# Patient Record
Sex: Female | Born: 1940 | Race: White | Hispanic: No | Smoking: Former smoker
Health system: Southern US, Community
[De-identification: ages and names within clinical notes are randomized; demographics above are authoritative.]

## PROBLEM LIST (undated history)

## (undated) DIAGNOSIS — E7800 Pure hypercholesterolemia, unspecified: Secondary | ICD-10-CM

## (undated) DIAGNOSIS — I10 Essential (primary) hypertension: Secondary | ICD-10-CM

## (undated) HISTORY — PX: OTHER SURGICAL HISTORY: SHX169

## (undated) HISTORY — DX: Essential (primary) hypertension: I10

## (undated) HISTORY — DX: Pure hypercholesterolemia, unspecified: E78.00

## (undated) HISTORY — PX: CHOLECYSTECTOMY: SHX55

---

## 2001-02-16 ENCOUNTER — Encounter: Payer: Self-pay | Admitting: Internal Medicine

## 2001-02-16 ENCOUNTER — Ambulatory Visit (HOSPITAL_COMMUNITY): Admission: RE | Admit: 2001-02-16 | Discharge: 2001-02-16 | Payer: Self-pay | Admitting: Internal Medicine

## 2001-02-28 ENCOUNTER — Ambulatory Visit (HOSPITAL_COMMUNITY): Admission: RE | Admit: 2001-02-28 | Discharge: 2001-02-28 | Payer: Self-pay | Admitting: Internal Medicine

## 2001-02-28 ENCOUNTER — Encounter: Payer: Self-pay | Admitting: Internal Medicine

## 2001-03-18 ENCOUNTER — Observation Stay (HOSPITAL_COMMUNITY): Admission: RE | Admit: 2001-03-18 | Discharge: 2001-03-19 | Payer: Self-pay | Admitting: General Surgery

## 2002-01-20 ENCOUNTER — Ambulatory Visit (HOSPITAL_COMMUNITY): Admission: RE | Admit: 2002-01-20 | Discharge: 2002-01-20 | Payer: Self-pay | Admitting: Internal Medicine

## 2002-01-20 ENCOUNTER — Encounter: Payer: Self-pay | Admitting: Internal Medicine

## 2002-04-28 ENCOUNTER — Other Ambulatory Visit: Admission: RE | Admit: 2002-04-28 | Discharge: 2002-04-28 | Payer: Self-pay | Admitting: Obstetrics & Gynecology

## 2002-10-03 ENCOUNTER — Ambulatory Visit (HOSPITAL_COMMUNITY): Admission: RE | Admit: 2002-10-03 | Discharge: 2002-10-03 | Payer: Self-pay | Admitting: Internal Medicine

## 2002-10-03 ENCOUNTER — Encounter: Payer: Self-pay | Admitting: Internal Medicine

## 2002-10-04 ENCOUNTER — Ambulatory Visit (HOSPITAL_COMMUNITY): Admission: RE | Admit: 2002-10-04 | Discharge: 2002-10-04 | Payer: Self-pay | Admitting: Obstetrics & Gynecology

## 2002-10-04 ENCOUNTER — Encounter: Payer: Self-pay | Admitting: Obstetrics & Gynecology

## 2002-12-01 ENCOUNTER — Encounter: Admission: RE | Admit: 2002-12-01 | Discharge: 2003-03-01 | Payer: Self-pay

## 2003-08-07 ENCOUNTER — Encounter: Payer: Self-pay | Admitting: Internal Medicine

## 2003-08-07 ENCOUNTER — Ambulatory Visit (HOSPITAL_COMMUNITY): Admission: RE | Admit: 2003-08-07 | Discharge: 2003-08-07 | Payer: Self-pay | Admitting: Internal Medicine

## 2008-06-19 ENCOUNTER — Ambulatory Visit (HOSPITAL_COMMUNITY): Admission: RE | Admit: 2008-06-19 | Discharge: 2008-06-19 | Payer: Self-pay | Admitting: Internal Medicine

## 2008-06-27 ENCOUNTER — Ambulatory Visit (HOSPITAL_COMMUNITY): Admission: RE | Admit: 2008-06-27 | Discharge: 2008-06-27 | Payer: Self-pay | Admitting: Internal Medicine

## 2009-05-28 ENCOUNTER — Ambulatory Visit (HOSPITAL_COMMUNITY): Admission: RE | Admit: 2009-05-28 | Discharge: 2009-05-28 | Payer: Self-pay | Admitting: Internal Medicine

## 2009-06-25 ENCOUNTER — Ambulatory Visit (HOSPITAL_COMMUNITY): Admission: RE | Admit: 2009-06-25 | Discharge: 2009-06-25 | Payer: Self-pay | Admitting: Internal Medicine

## 2009-06-25 ENCOUNTER — Ambulatory Visit (HOSPITAL_COMMUNITY): Admission: RE | Admit: 2009-06-25 | Discharge: 2009-06-25 | Payer: Self-pay | Admitting: Urology

## 2009-10-22 ENCOUNTER — Ambulatory Visit (HOSPITAL_COMMUNITY): Admission: RE | Admit: 2009-10-22 | Discharge: 2009-10-22 | Payer: Self-pay | Admitting: Internal Medicine

## 2009-10-22 ENCOUNTER — Encounter: Payer: Self-pay | Admitting: Internal Medicine

## 2010-02-20 IMAGING — US US RENAL
1 series · 14 of 25 positions shown · non-contrast
Comparison: CT abdomen 06/25/2009.

CLINICAL DATA: Hematuria.

RENAL/URINARY TRACT ULTRASOUND COMPLETE

[Series 1: us renal · 0.30mm/px · 14 of 45 slices shown]
[im 1/45]
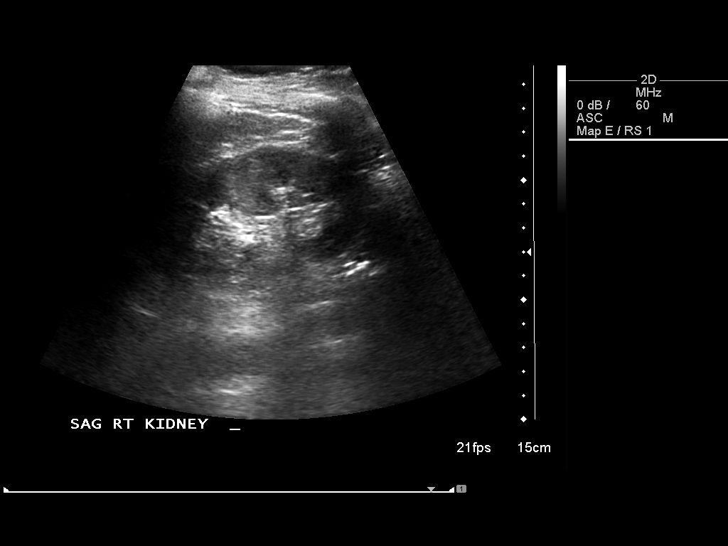
[im 4/45]
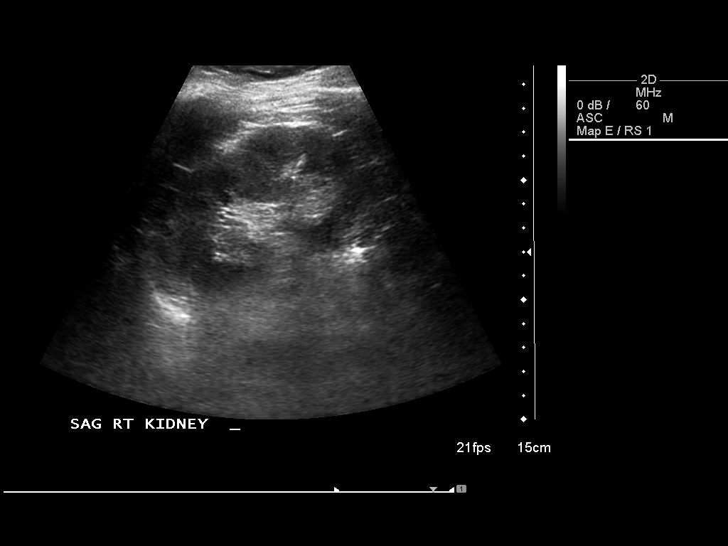
[im 8/45]
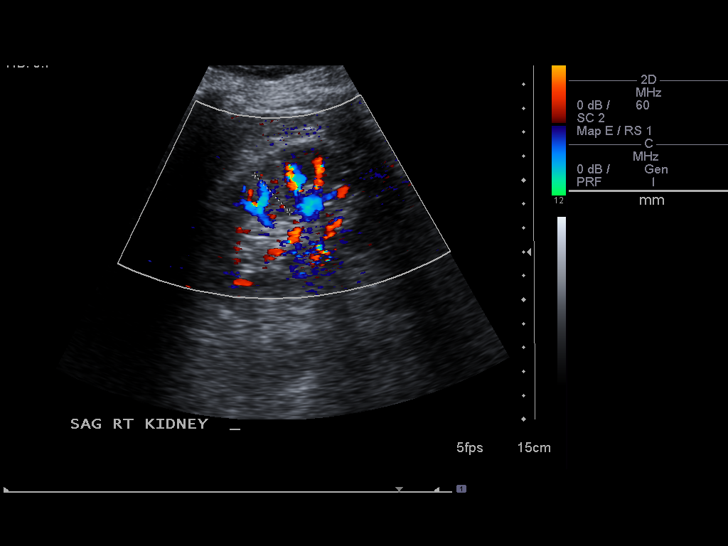
[im 12/45]
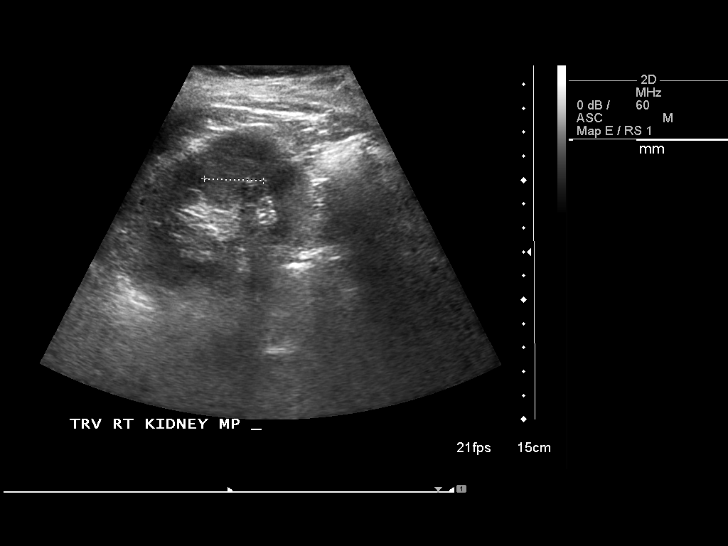
[im 15/45]
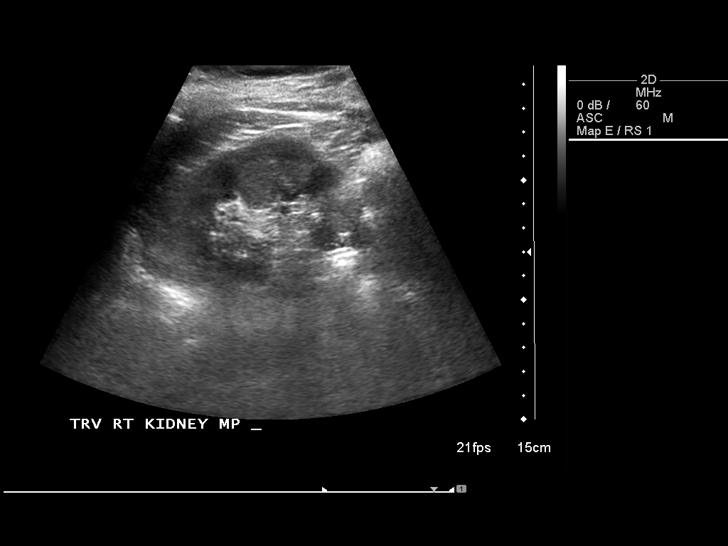
[im 17/45]
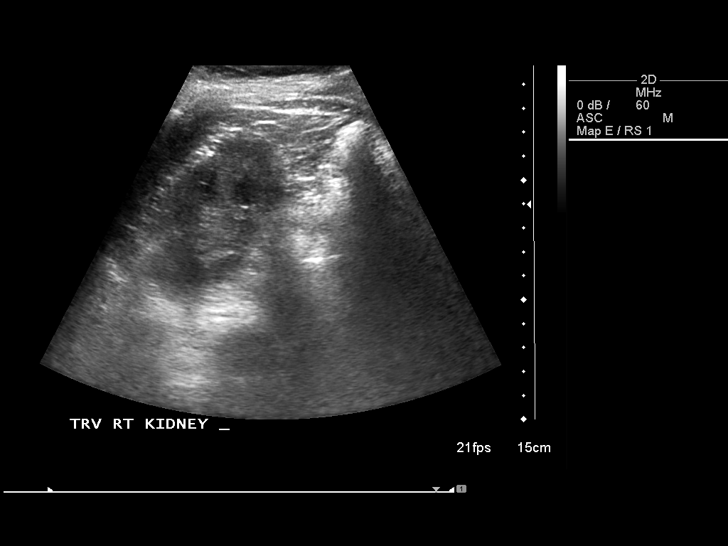
[im 21/45]
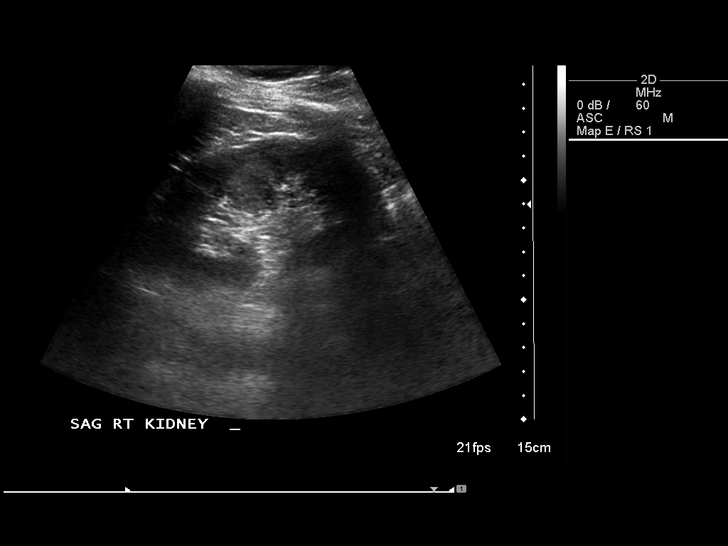
[im 24/45]
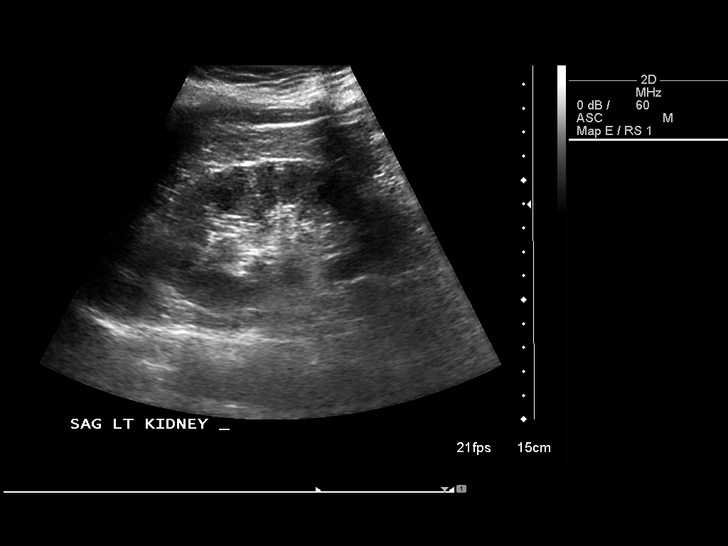
[im 28/45]
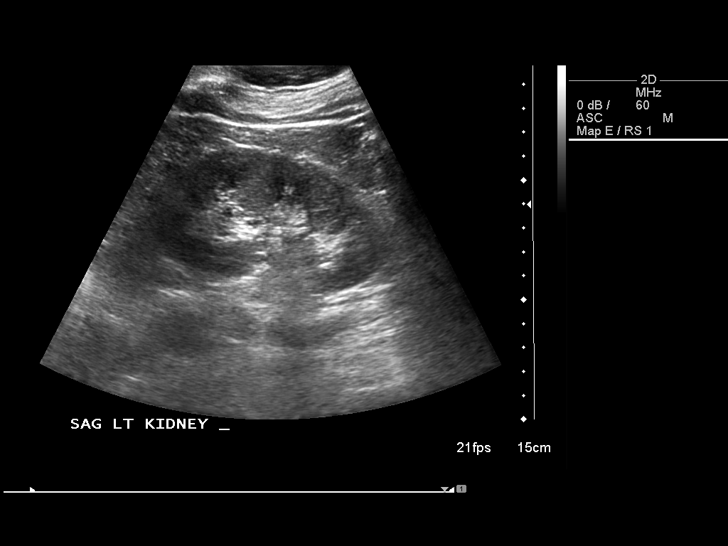
[im 30/45]
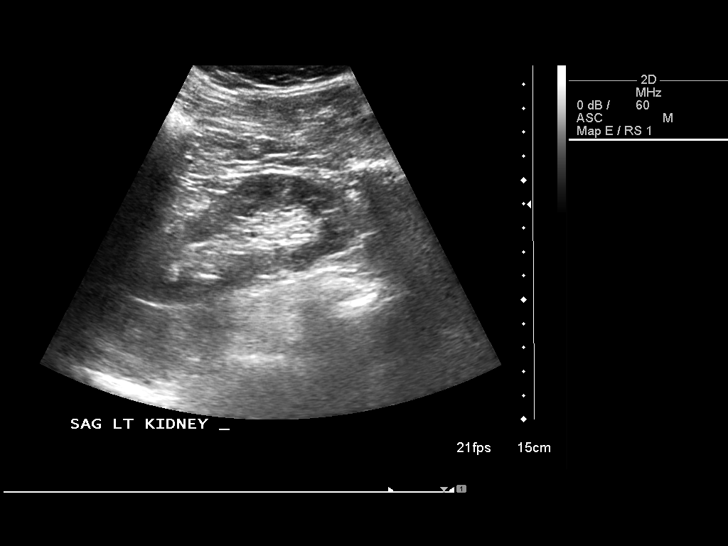
[im 34/45]
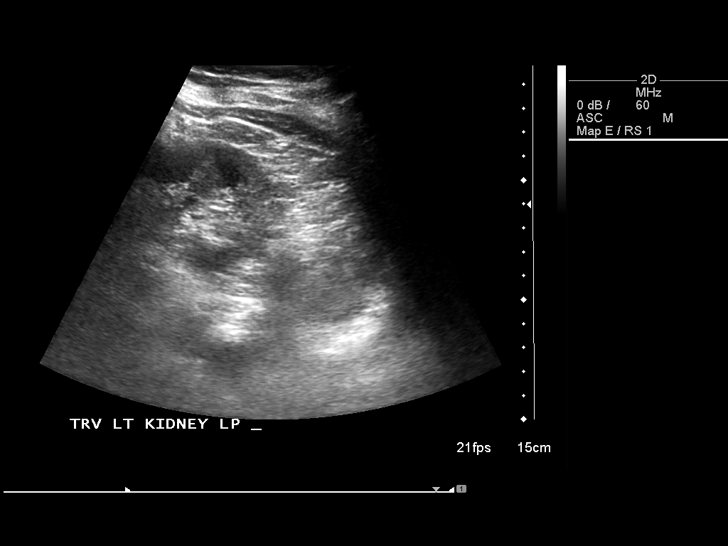
[im 37/45]
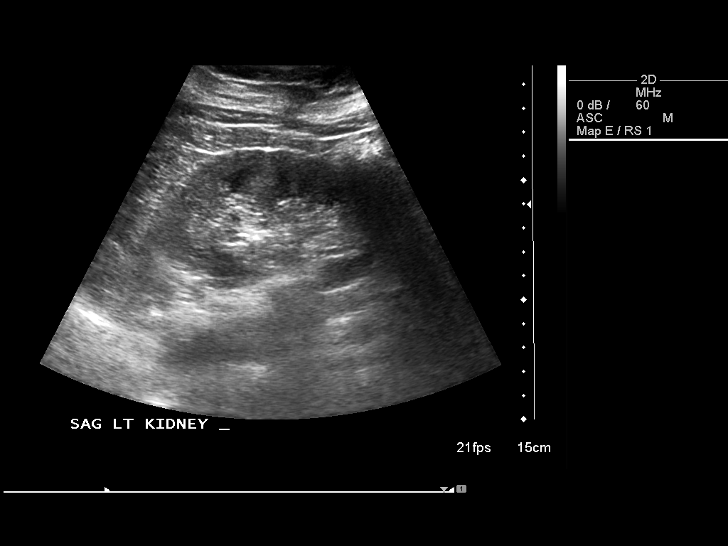
[im 41/45]
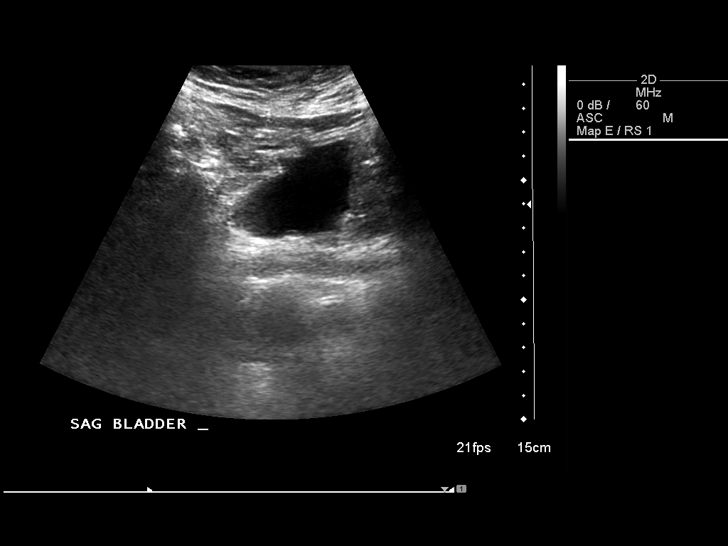
[im 45/45]
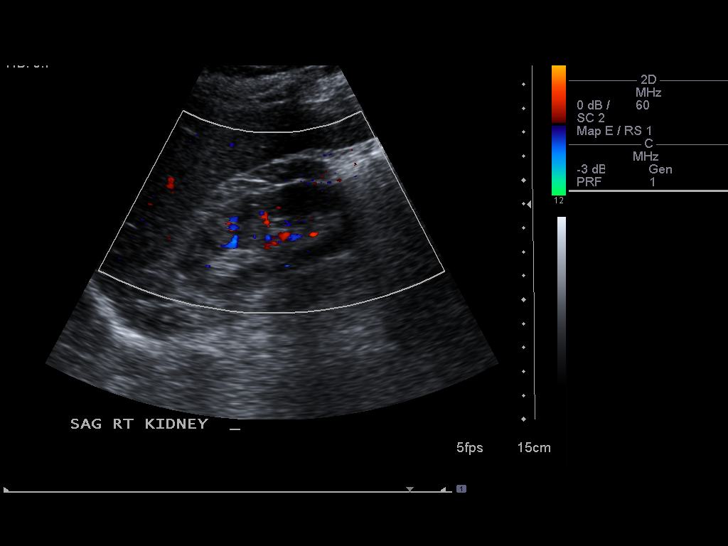

[14 of 25 positions shown; findings below may reference images not displayed]

FINDINGS: Right Kidney:  9.5 cm in length. Normal renal cortical thickness
and echogenicity without focal lesions or hydronephrosis.

Left Kidney:  10.2 cm in length. Normal renal cortical thickness
and echogenicity without focal lesions or hydronephrosis.

Bladder:  Normal
IMPRESSION: Unremarkable renal ultrasound examination.

## 2010-02-20 IMAGING — CT CT PELVIS W/ CM
2 of 5 series · 16 of 46 positions shown, 18 images · IV contrast (Omnipaque 300)
Comparison: None

CT ABDOMEN

Addendum Begins

Review of the CT scan suggest findings of acute diverticulitis
involving the distal descending colon (transaxial images 44 - 50).
Addendum Ends
CLINICAL DATA: Hematuria.  Pelvic pain.
CT ABDOMEN AND PELVIS WITH CONTRAST
TECHNIQUE: Multidetector CT imaging of the abdomen and pelvis was
performed using the standard protocol following bolus
administration of intravenous contrast.
Contrast: 100 ml 1mnipaque-ICC IV.

[Series 2: abd_pel_with 5.0 b40f · axial · 0.65mm/px · z∈[-471,-86]mm · 13 of 87 slices shown, 15 images]
[im 5/87  soft-tissue]
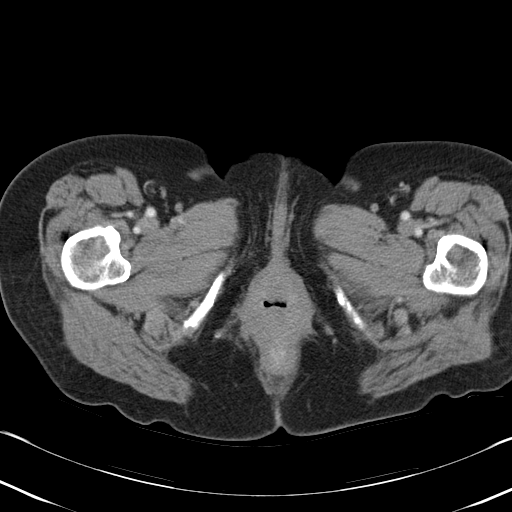
[im 5/87  bone]
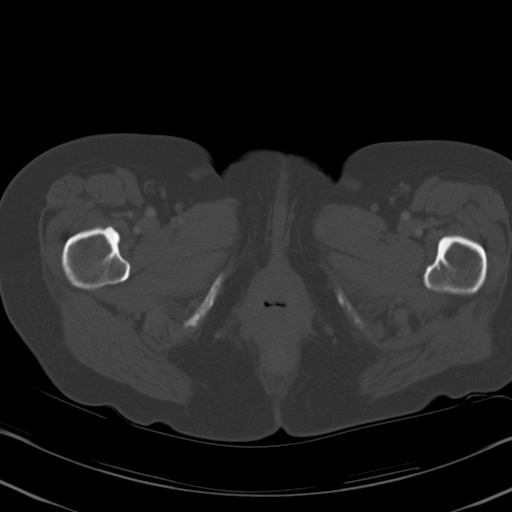
[im 10/87  soft-tissue]
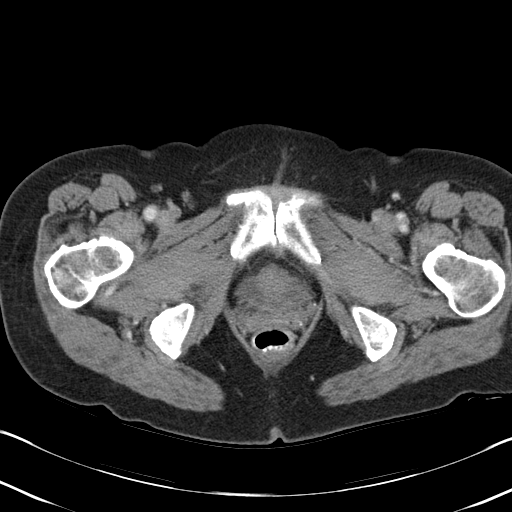
[im 20/87  soft-tissue]
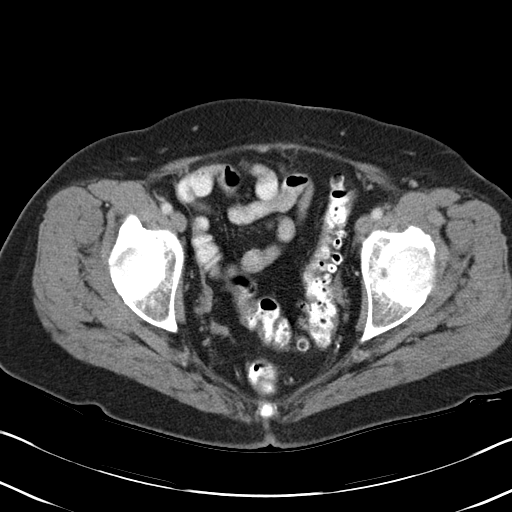
[im 24/87  soft-tissue]
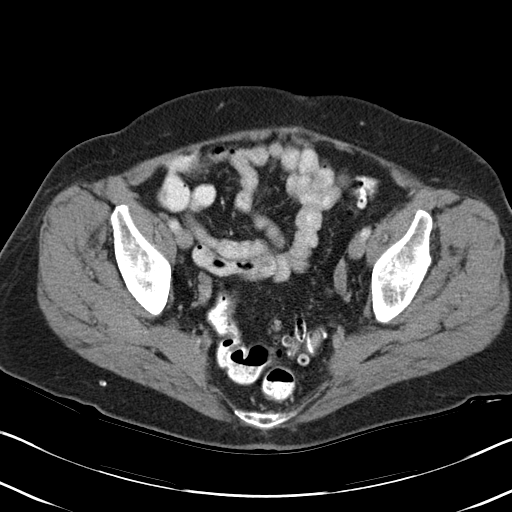
[im 29/87  soft-tissue]
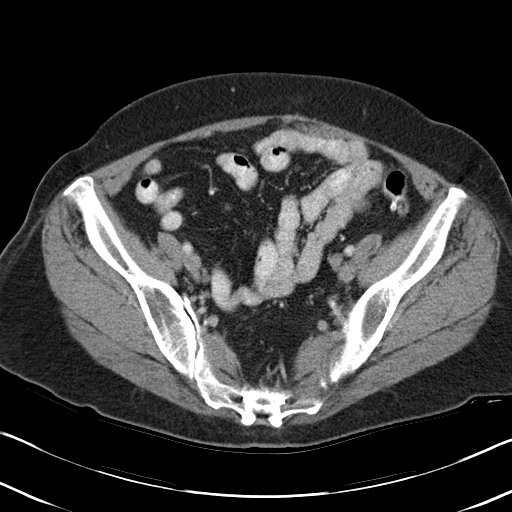
[im 39/87  soft-tissue]
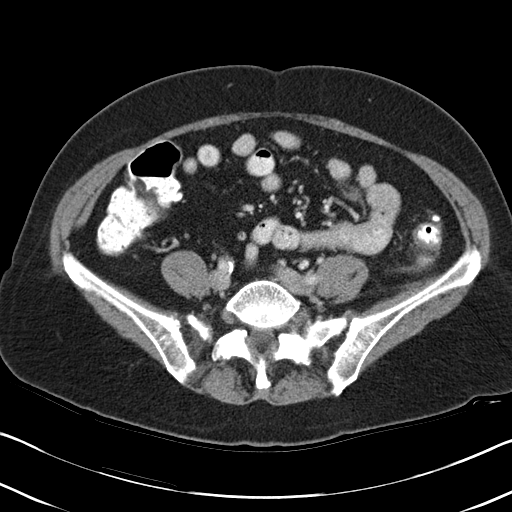
[im 44/87  soft-tissue]
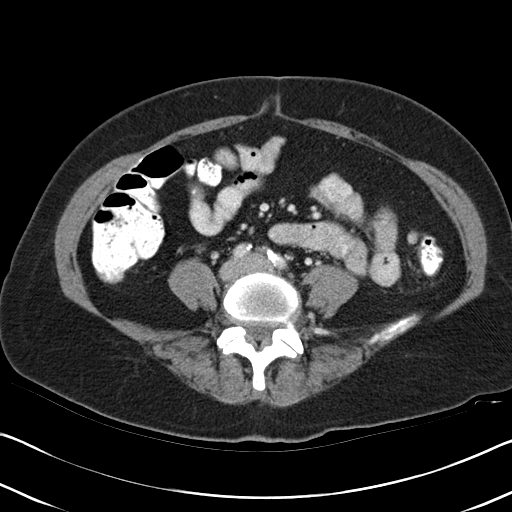
[im 48/87  soft-tissue]
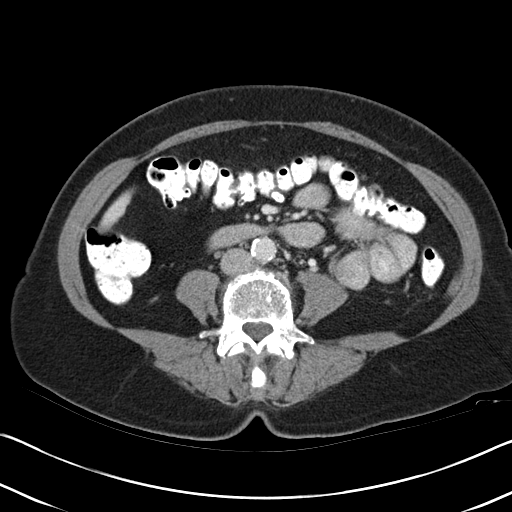
[im 58/87  soft-tissue]
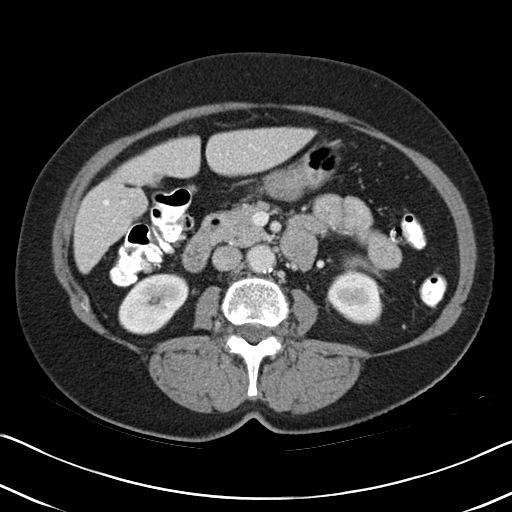
[im 58/87  bone]
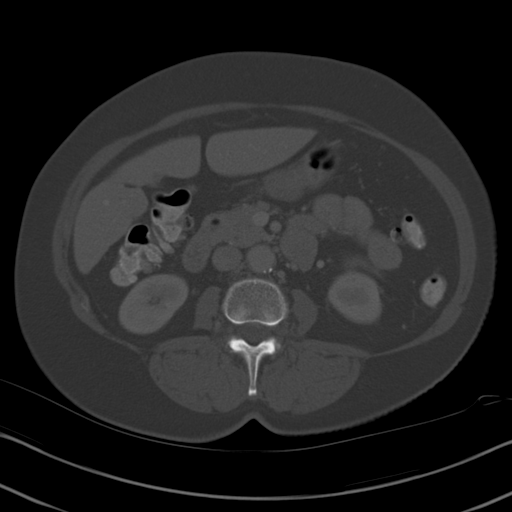
[im 63/87  soft-tissue]
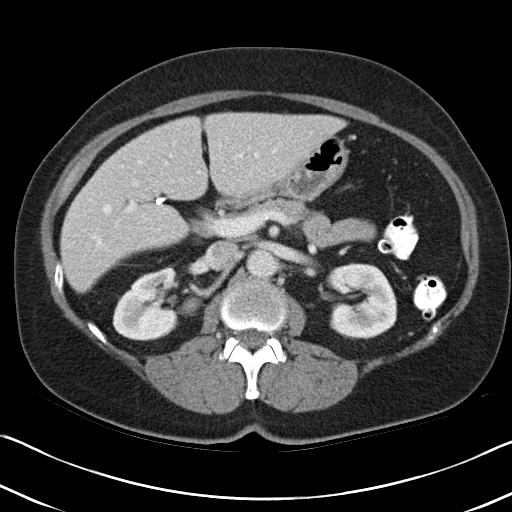
[im 67/87  soft-tissue]
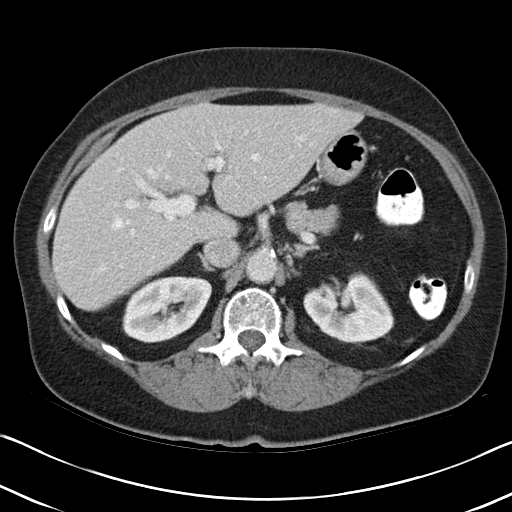
[im 77/87  soft-tissue]
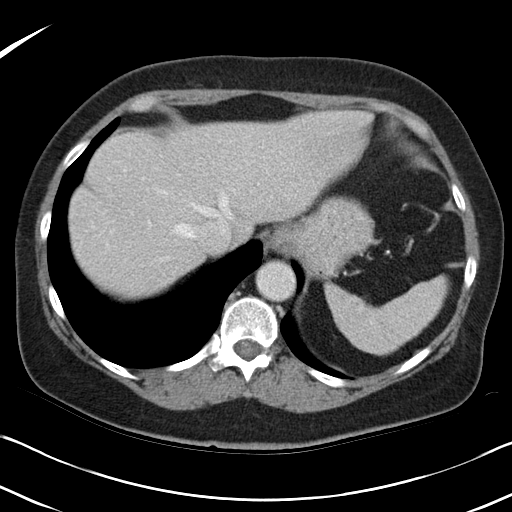
[im 82/87  soft-tissue]
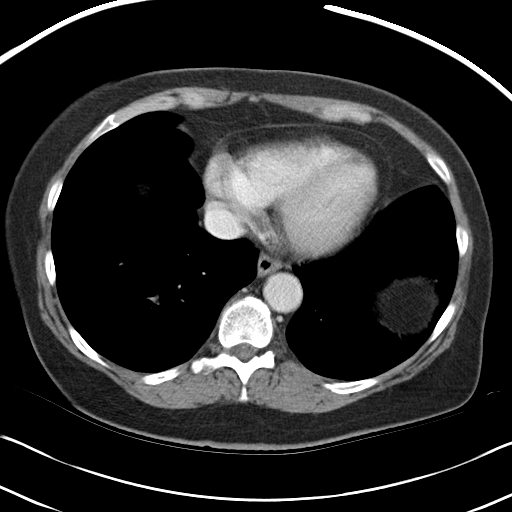

[Series 4: mpr cor post contrast (id) · coronal · 0.63mm/px · 3 of 73 slices shown]
[im 25/73  soft-tissue]
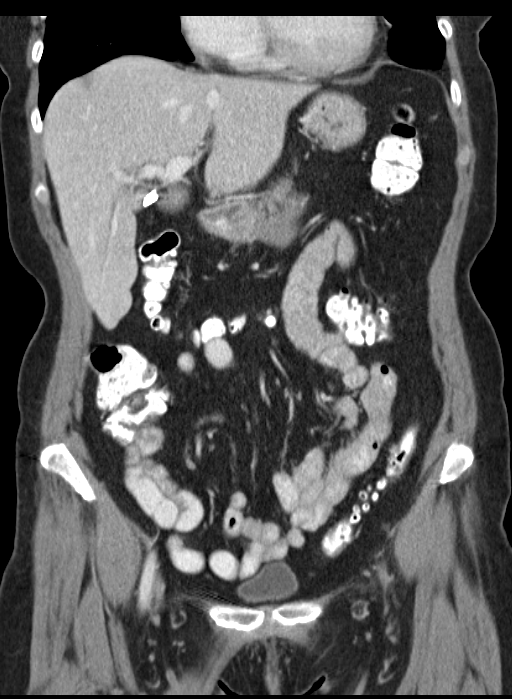
[im 33/73  soft-tissue]
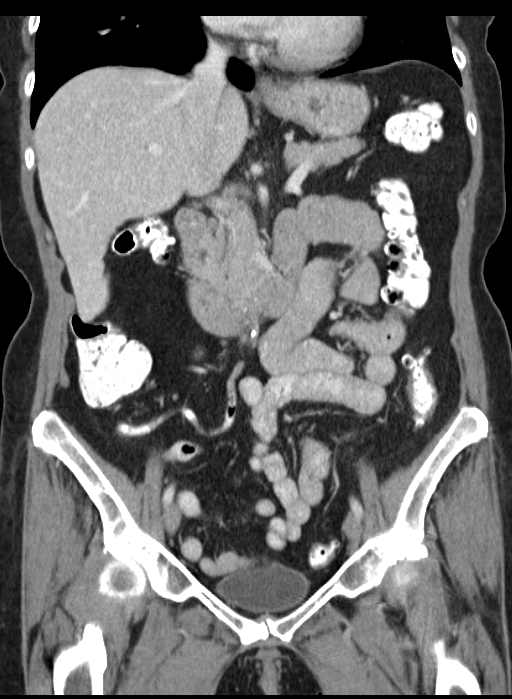
[im 41/73  soft-tissue]
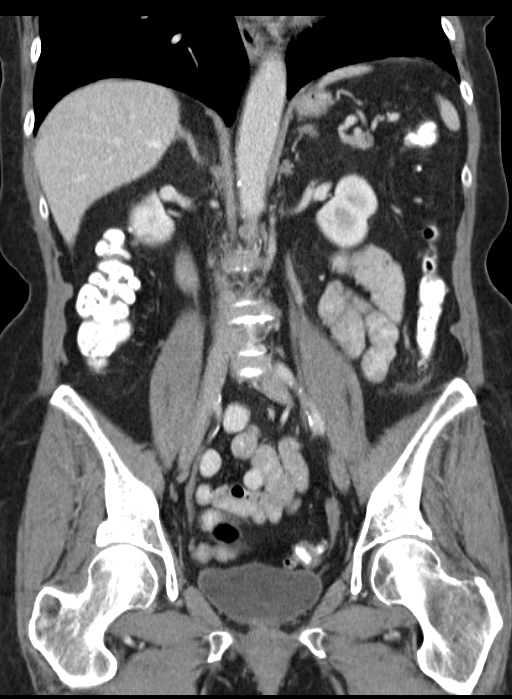

[16 of 46 positions shown; findings below may reference images not displayed]

FINDINGS: Normal liver, spleen, pancreas, kidneys, and adrenal
glands.  No abdominal mass or adenopathy.  No mesenteric or
retroperitoneal abnormality.
IMPRESSION: Unremarkable CT scan of the abdomen.

CT PELVIS
FINDINGS: No pelvic mass or adenopathy.  Hysterectomy.  Sigmoid
colon diverticula.  Negative for diverticulitis.  Urinary bladder
unremarkable.
IMPRESSION: Negative pelvic CT scan.  Sigmoid diverticulosis.

## 2010-10-30 ENCOUNTER — Ambulatory Visit (HOSPITAL_COMMUNITY)
Admission: RE | Admit: 2010-10-30 | Discharge: 2010-10-30 | Payer: Self-pay | Source: Home / Self Care | Attending: Internal Medicine | Admitting: Internal Medicine

## 2010-11-23 ENCOUNTER — Encounter: Payer: Self-pay | Admitting: Internal Medicine

## 2011-03-20 NOTE — Consult Note (Signed)
NAME:  Chloe Chaney, Chloe Chaney                       ACCOUNT NO.:  [id number]   MEDICAL RECORD NO.:  [id number]                   PATIENT TYPE:  REC   LOCATION:  [facility]                                  FACILITY:  [facility]   PHYSICIAN:  Zachary George, DO                      DATE OF BIRTH:  [birth year]   DATE OF CONSULTATION:  01/18/2003  DATE OF DISCHARGE:                                   CONSULTATION   HISTORY OF PRESENT ILLNESS:  The patient returns to the clinic today for  reevaluation.  She was last seen on December 05, 2002, at which time she  underwent lumbar epidural steroid injection for degenerative disk disease of  the lumbar spine with left lower extremity radicular symptoms.  The patient  states that she had nearly 100% relief of her lower back pain and left lower  extremity radicular symptoms following the epidural injection.  She states  that the pain has gradually returned but is not quite back to baseline yet.  She requests a repeat injection.  Her pain today is 5/10 on a subjective  scale and described as achy, again with radicular symptoms into the left  lower extremity.  I reviewed the health and history form and 14-point review  of systems.  No new neurologic complaints.   PHYSICAL EXAMINATION:  VITAL SIGNS:  Blood pressure 131/66, pulse 76,  respirations 20, O2 saturations 95% on room air.  EXTREMITIES:  Manual muscle testing is 5/5 bilateral lower extremities.  Sensory examination is intact to light touch bilateral lower extremities.  Muscle stretch reflexes are 2+/4 bilateral lower extremities.  There is  tenderness to palpation bilateral lumbar paraspinal muscles.   IMPRESSION:  1. Degenerative disk disease of the lumbar spine with left lower extremity     radicular symptoms in an L5 distribution, improved with lumbar epidural     steroids.  2. Chronic low back pain.   PLAN:  1. Repeat lumbar epidural steroid injection.  2. Patient to return to clinic in two to four  weeks as needed for     reevaluation and possible repeat epidural injection as predicated upon     the patient's symptoms and response.   PROCEDURE:  Lumbar epidural steroid injection:  The procedure was described  to the patient in detail including risks, benefits, limitations,  alternatives, and potential side effects.  The risks include but are not  limited to bleeding, infection, spinal headache, nerve injury, paralysis,  failure to relieve pain, increased pain, and allergic reaction to  medications.  The patient wishes to proceed.  Informed consent was obtained.  The patient was brought back to the fluoroscopy suite and placed on the  table in prone position.  The skin was prepped and draped in the usual  sterile fashion.  The skin and subcutaneous tissues were anesthetized with 3  mL of preservative-free 1% lidocaine.  Under  direct fluoroscopic guidance an  18-gauge, 3-1/2-inch Hustead needle was advanced into the left paramedial L5-  S1 epidural space with loss of resistance technique.  No CSF  hemoparesthesias were noted.  This was then followed by injection of 1.5 mL  of Kenalog 40 mg/mL plus 2.5 mL of normal saline with needle flush.  There  were no complications.  The patient tolerated the procedure well.  Discharge  instructions were given.  The patient was released in stable condition.   The patient was educated on the above findings and recommendations and  understands.  There were no barriers to communication.                                               Zachary George, DO    JW/MEDQ  D:  01/18/2003  T:  01/19/2003  Job:  [id number]   cc:   Cristi Loron, M.D.  [address]  [place]  [place] [address]  Fax: [phone]

## 2011-03-20 NOTE — Consult Note (Signed)
NAME:  Chloe Chaney, Chloe Chaney                       ACCOUNT NO.:  [id number]   MEDICAL RECORD NO.:  [id number]                   PATIENT TYPE:  REC   LOCATION:  [facility]                                  FACILITY:  [facility]   PHYSICIAN:  Zachary George, DO                      DATE OF BIRTH:  06-Dec-1940   DATE OF CONSULTATION:  12/05/2002  DATE OF DISCHARGE:                                   CONSULTATION   HISTORY OF PRESENT ILLNESS:  The patient returns to clinic today for a  lumbar epidural steroid injection.  She was initially evaluated yesterday.  She has degenerative disc disease of the lumbar spine with herniated nucleus  pulposus at L3-4 and disc protrusion at L4-5 with left lower extremity  radicular symptoms and an L5 distribution.  She has had no changes in her  symptoms since evaluation yesterday.  Her pain today is a 6 to 7/10 on  subjective scale.  No new neurologic complaints.  Blood pressure 122/80,  pulse 72, respiratory rate 20, O2 saturation is 95% on room air.   IMPRESSION:  1. Degenerative disc disease of lumbar spine with left lower extremity     radicular symptoms in an L5 distribution.  2. Chronic low back pain.   PLAN:  1. Lumbar epidural steroid injection.  2. The patient is to return to clinic in two weeks for reevaluation and     possible repeat injection as predicated upon the patient's symptoms and     response.   PROCEDURE:  Lumbar epidural steroid injection.  The procedure was described  to the patient in detail including risks, benefits, limitations and  alternatives.  Risks include, but are not limited to, bleeding, infection,  spinal headache, nerve injury, paralysis, failure to relieve pain, increased  pain.  The patient understands and wishes to proceed.  Informed consent was  obtained.   DESCRIPTION OF PROCEDURE:  The patient was brought back to the fluoroscopy  suite and placed on the table in prone position.  The skin was prepped and  draped in the usual  sterile fashion.  Skin and subcutaneous tissues were  anesthetized with 3 cc of preservative-free 1% lidocaine.  Under direct  fluoroscopic guidance, an 18-gauge 3-1/2  inch Hustead needle was advanced  into the left paramedian L5-S1 epidural space with loss of resistance  technique.  No CSF, heme, or paresthesias were noted.  This was then  followed by an injection of 1 cc of Kenalog 40 mg/cc plus 4 cc of normal  saline with needle flush. There were no complications.  The patient  tolerated the procedure well.  Discharge instructions were given.   The patient was educated on the above findings and recommendations and  understands.  There were no barriers to communication.  Zachary George, DO    JW/MEDQ  D:  12/05/2002  T:  12/05/2002  Job:  [id number]   cc:   Cristi Loron, M.D.  [address]  [place]  [place] [address]  Fax: [phone]

## 2011-03-20 NOTE — Consult Note (Signed)
NAME:  Chloe Chaney, Chloe Chaney                       ACCOUNT NO.:  [id number]   MEDICAL RECORD NO.:  [id number]                   PATIENT TYPE:  REC   LOCATION:  [facility]                                  FACILITY:  [facility]   PHYSICIAN:  Zachary George, DO                      DATE OF BIRTH:  [birth year]   DATE OF CONSULTATION:  12/04/2002  DATE OF DISCHARGE:                                   CONSULTATION   Dear Dr. Lovell Sheehan:   Thank you very much for kindly referring this patient to [facility] for evaluation.  The patient was seen in our  clinic today.  Please refer to the file for details regarding the History  and Physical Examination and Treatment Plan. Once again, thank you for  allowing [place] to participate in the care of this patient.   CHIEF COMPLAINT:  Left lower back pain and upper back pain.   HISTORY OF PRESENT ILLNESS:  The patient is a pleasant 70 year old right-  hand dominant female who was kindly referred by Dr. Lovell Sheehan for evaluation  for possible injection therapy and pain management.  The patient complains  of a five-year history of over back pain which she states has progressively  worsened over the past several months.  She states that the pain radiates to  her left buttock, left lateral thigh and calf, and infrequently into the  right lower extremity.  Her symptoms seem to be worse with sitting.  She  notes some intermittent paresthesias in the left lower extremity as well as  some rare paresthesias in the right foot with driving. She had similar  symptoms beginning about five years ago but less severe.  She was evaluated  by a physician here in [place] at that time and states that an MRI was  done which, according to her report, was negative.  She was seen in physical  therapy but states it really did not help.  She recently had an MRI of her  lumbar spine on 10/03/2002 which, according to the report, reveals an L3-4  disk herniation.   Unfortunately I only have page 1 of a two-page report, and  I am uncertain of further abnormalities at the L4-5 and L5-S1 levels.  At L2-  3, there is mild disk bulge without focal disk herniation.  The patient did  not bring her films today for review.  She was evaluated by Dr. Lovell Sheehan who  does not feel she is a surgical candidate.   In addition, the patient complains of some pain in her left upper back which  is mild.  Her lower back pain rates 6 to 7/10 on a subjective scale and is  described as constant, achy, burning, stabbing, with associated numbness and  tingling as noted.  The symptoms are worse with walking, bending, sitting,  and working and improved with rest.  Her function and quality of life  indices have declined to some degree secondary to the pain.  I reviewed the  health and history form and 14-point Review of Systems.  The patient denies  bowel or bladder dysfunction.  She denies fever, chills, night sweats, or  weight loss.  She is not currently taking any pain medications but has tried  various nonsteroidal anti-inflammatory medications including ibuprofen,  Aleve, and Celebrex which did not help.  She was given a prescription by Dr.  Lovell Sheehan for Vioxx, and she states she was unable to tolerate the VIOXX.   PAST MEDICAL HISTORY:  Anxiety.   PAST SURGICAL HISTORY:  Cholecystectomy, partial hysterectomy.   FAMILY HISTORY:  Heart disease, cancer, diabetes.   SOCIAL HISTORY:  The patient denies smoking, alcohol, or illicit drug use.  She is married and not currently working.  She is retired from [employer].   ALLERGIES:  No known drug allergies.  The patient is unsure of whether or  not she has an allergy to STEROIDS, although she states she got some steroid  pills for respiratory problem about five years ago and thinks it sped up her  heart rate; otherwise, she denies any other reaction.   MEDICATIONS:  Accupril, alprazolam, colestyramine.   PHYSICAL  EXAMINATION:  GENERAL:  Healthy female in no acute distress.  VITAL SIGNS:  Blood pressure 139/61, pulse 76, respirations 20, O2  saturation 94% on room air.  NEUROLOGIC:  Mood and affect are appropriate.  She is alert and oriented.  Examination of the spine reveals a level pelvis without scoliosis. There is  normal lumbar lordosis and cervical lordosis. Range of motion of the lumbar  spine is full in all planes with pain mildly on extension.  Range of motion  of the cervical spine is full in all planes without discomfort.  Palpatory  examination reveals tenderness to palpation bilateral lumbar paraspinals.  There is also some mild tenderness bilateral greater trochanters.  She has  trigger points in the left upper trapezius, levator scapulae, and  infraspinatus muscles.  Palpation of the levator scapulae trigger point  refers pain into the left upper extremity which is consistent with her  current symptoms.  Manual muscle testing is 5/5 bilateral upper and lower  extremities.  Sensory examination is intact to light touch bilateral upper  and lower extremities.  Muscle strength reflexes are 2+/4 bilateral biceps,  triceps, brachial radialis, pronator teres, patella, medial hamstrings, and  Achilles.  Spurling maneuver is negative bilaterally.  Straight leg raise is  negative bilaterally.  Faber's test is negative bilaterally.  There is no  abnormal tone noted in the upper and lower extremities.  No atrophy noted in  the upper and lower extremities.  No heat, erythema, or edema in the upper  and lower extremities.   IMPRESSION:  1. Low back pain with left significantly greater than right lower extremity     pain in an L5 distribution.  2. Degenerative disk disease in lumbar spine with L3-4 disk herniation per     MRI report.  3. Myofascial pain syndrome.   PLAN:  1. Discuss treatment options with the patient.  This included medication    management versus minimally invasive procedures  to help control her pain.     At this time, I think she is an appropriate candidate for a trial of     lumbar epidural steroid injections to help decrease her pain and improve     her functional  abilities.  I discussed this with her at length including     risks, benefits, indications, and alternatives, and she wishes to     proceed.  2. Consider medications including a different non-steroidal anti-     inflammatory medication such as Bextra or Mobic.  Would also give     consideration to Ultracet and possibly an anti-epileptic drug if symptoms     are not improving with the above treatment.  3. Consider physical therapy for myofascial release techniques and scapular     stabilization exercises to address the myofascial pain component in the     upper back.  Would consider a trial of trigger point injections if not     improving.  4. The patient is to return to clinic for lumbar epidural steroid injection.   The patient was educated about findings and recommendations and understands.  No barriers to communication.                                               Zachary George, DO    JW/MEDQ  D:  12/04/2002  T:  12/04/2002  Job:  [id number]   cc:   Cristi Loron, M.D.  [address]  [place]  [place] [address]  Fax: [phone]   Madelin Rear. Sherwood Gambler, M.D.  [address]  [place]  [place] [address]  Fax: [phone]

## 2011-10-06 ENCOUNTER — Other Ambulatory Visit (HOSPITAL_COMMUNITY): Payer: Self-pay | Admitting: Internal Medicine

## 2011-10-06 DIAGNOSIS — Z139 Encounter for screening, unspecified: Secondary | ICD-10-CM

## 2011-11-02 ENCOUNTER — Ambulatory Visit (HOSPITAL_COMMUNITY): Payer: Self-pay

## 2012-09-12 ENCOUNTER — Other Ambulatory Visit (HOSPITAL_COMMUNITY): Payer: Self-pay | Admitting: Internal Medicine

## 2012-09-12 DIAGNOSIS — Z139 Encounter for screening, unspecified: Secondary | ICD-10-CM

## 2012-09-23 ENCOUNTER — Ambulatory Visit (HOSPITAL_COMMUNITY)
Admission: RE | Admit: 2012-09-23 | Discharge: 2012-09-23 | Disposition: A | Discharge status: Home / Self Care | Payer: Medicare Other | Source: Ambulatory Visit | Attending: Internal Medicine | Admitting: Internal Medicine

## 2012-09-23 DIAGNOSIS — Z1231 Encounter for screening mammogram for malignant neoplasm of breast: Secondary | ICD-10-CM | POA: Insufficient documentation

## 2012-09-23 DIAGNOSIS — Z139 Encounter for screening, unspecified: Secondary | ICD-10-CM

## 2015-02-12 ENCOUNTER — Ambulatory Visit (INDEPENDENT_AMBULATORY_CARE_PROVIDER_SITE_OTHER): Payer: Medicare HMO

## 2015-02-12 ENCOUNTER — Ambulatory Visit (INDEPENDENT_AMBULATORY_CARE_PROVIDER_SITE_OTHER): Payer: Medicare HMO | Admitting: Orthopedic Surgery

## 2015-02-12 VITALS — BP 152/82 | Ht 62.0 in | Wt 141.6 lb

## 2015-02-12 DIAGNOSIS — M6588 Other synovitis and tenosynovitis, other site: Secondary | ICD-10-CM

## 2015-02-12 DIAGNOSIS — M25571 Pain in right ankle and joints of right foot: Secondary | ICD-10-CM | POA: Diagnosis not present

## 2015-02-12 DIAGNOSIS — M7750 Other enthesopathy of unspecified foot: Secondary | ICD-10-CM

## 2015-02-12 NOTE — Progress Notes (Signed)
Patient ID: Chloe Chaney, female   DOB: [birth year], 74 y.o.   MRN: [id number]  Chief Complaint  Patient presents with  . Ankle Pain    Right ankle pain s/p multiple falls...ref z hall     Chloe SoxMarilyn L Chaney is a 74 y.o. female.   HPI 74 year old female complains of right greater than left anterior ankle pain over the tibialis anterior which started about 2 years ago she denies any trauma related. She did have a fall but it's unrelated. She says she has burning throbbing aching pain in her ankles and pain with dorsiflexion of the foot. She was treated with meloxicam and tramadol with some relief. Standing and walking seems to bother her the most and she complains of a rash on the outer side of the foot which is related to an infection but unrelated to her visit today. Review of Systems Hearing loss is noted ankle leg swelling is noted depression anxiety as noted back pain is noted muscle weakness limb pain joint pain and rash burning pain legs numbness she is not a [facility] patient  She does have hypertension  She had a hysterectomy in 1988 a gallbladder removed in 1999 she was treated for diverticulitis in 1999  Her medications are tramadol 50 mg simvastatin and quinapril  No past medical history on file.  No past surgical history on file.  No family history on file.  Social History History  Substance Use Topics  . Smoking status: Not on file  . Smokeless tobacco: Not on file  . Alcohol Use: Not on file    Allergies not on file  Current Outpatient Prescriptions  Medication Sig Dispense Refill  . quinapril (ACCUPRIL) 40 MG tablet Take 40 mg by mouth 2 (two) times daily.    . traMADol (ULTRAM) 50 MG tablet Take by mouth every 6 (six) hours as needed.     No current facility-administered medications for this visit.       Physical Exam Blood pressure 152/82, height 5\' 2"  (1.575 m), weight 141 lb 9.6 oz (64.229 kg). Physical Exam The patient is well developed  well nourished and well groomed. Orientation to person place and time is normal  Mood is pleasant. Ambulatory status she walks fine. She has bilateral tenderness over the tibialis anterior painful dorsiflexion plantar flexion in both ankles with with both ankle stable and motor function intact skin on the left foot is normal she has a rash over the right foot which is quite S and at present. No sensory changes and good dorsal pulses   Data Reviewed X-ray right ankle normal  Assessment Encounter Diagnoses  Name Primary?  . Right ankle pain   . Tendinitis of ankle or foot Yes    Plan Recommend diclofenac lidocaine menthol cream from [facility] 35 and 1% respectively for 6 weeks. Follow-up as needed

## 2015-02-13 ENCOUNTER — Encounter (INDEPENDENT_AMBULATORY_CARE_PROVIDER_SITE_OTHER): Payer: Self-pay | Admitting: *Deleted

## 2015-03-14 ENCOUNTER — Ambulatory Visit (INDEPENDENT_AMBULATORY_CARE_PROVIDER_SITE_OTHER): Payer: Medicare HMO | Admitting: Internal Medicine

## 2015-04-17 ENCOUNTER — Encounter (INDEPENDENT_AMBULATORY_CARE_PROVIDER_SITE_OTHER): Payer: Self-pay | Admitting: Internal Medicine

## 2015-04-17 ENCOUNTER — Ambulatory Visit (INDEPENDENT_AMBULATORY_CARE_PROVIDER_SITE_OTHER): Payer: Medicare HMO | Admitting: Internal Medicine

## 2015-04-17 VITALS — BP 126/80 | HR 60 | Temp 97.6°F | Ht 62.0 in | Wt 137.3 lb

## 2015-04-17 DIAGNOSIS — I10 Essential (primary) hypertension: Secondary | ICD-10-CM | POA: Diagnosis not present

## 2015-04-17 DIAGNOSIS — M779 Enthesopathy, unspecified: Secondary | ICD-10-CM | POA: Insufficient documentation

## 2015-04-17 DIAGNOSIS — R197 Diarrhea, unspecified: Secondary | ICD-10-CM

## 2015-04-17 DIAGNOSIS — E7800 Pure hypercholesterolemia, unspecified: Secondary | ICD-10-CM | POA: Insufficient documentation

## 2015-04-17 MED ORDER — DICYCLOMINE HCL 10 MG PO CAPS: 10.0000 mg | ORAL_CAPSULE | Freq: Three times a day (TID) | ORAL | Status: DC

## 2015-04-17 MED ORDER — DICYCLOMINE HCL 10 MG PO CAPS: 10.0000 mg | ORAL_CAPSULE | Freq: Two times a day (BID) | ORAL | Status: AC

## 2015-04-17 NOTE — Patient Instructions (Signed)
Stool studies. OV in 3 months. Stool diary

## 2015-04-17 NOTE — Progress Notes (Signed)
   Subjective:    Patient ID: Chloe Chaney, female    DOB: 10-04-1941, 74 y.o.   MRN: [id number]  HPI Referred to our office by Dr. Margo Aye for IBS.  She tells me after she drinks coffee in the morning, she will have 4-5 BMs a day. She has urgency. She says as soon as food hits her stomach, she is running to the bathroom.  She has had a couple of accidents. Any foods will cause this.  Symptoms started about a year ago. She usually has BM about 10-12 stools a day. She takes Imodium as needed. She sometimes it is not diarrhea.  She tells me she has a hx of diverticulitis.  From records she had a colonoscopy December of 2015 Dr. Allena Katz with 5 polyps removed. Appetite is good. No weight loss. No abdominal pain. No melena or BRRB No recent antibiotics   10/18/2015 Hand H 15.2 and 44.4, MCV 89.0, Platelet ct 317, total bili 0.5, ALP 62, AST 20, ALT 15    Review of Systems Widowed, no children.   Past Medical History  Diagnosis Date  . High blood pressure   . High cholesterol     Past Surgical History  Procedure Laterality Date  . Partial hysterectomy    . Cholecystectomy      Allergies  Allergen Reactions  . Ivp Dye [Iodinated Diagnostic Agents]     Current Outpatient Prescriptions on File Prior to Visit  Medication Sig Dispense Refill  . quinapril (ACCUPRIL) 40 MG tablet Take 40 mg by mouth 2 (two) times daily.     No current facility-administered medications on file prior to visit.        Current Outpatient Prescriptions on File Prior to Visit  Medication Sig Dispense Refill  . quinapril (ACCUPRIL) 40 MG tablet Take 40 mg by mouth 2 (two) times daily.     No current facility-administered medications on file prior to visit.   Widowed, 4 children in good health      Objective:   Physical ExamBlood pressure 126/80, pulse 60, temperature 97.6 F (36.4 C), height 5\' 2"  (1.575 m), weight 137 lb 4.8 oz (62.279 kg).  Alert and oriented. Skin warm and dry. Oral  mucosa is moist.   . Sclera anicteric, conjunctivae is pink. Thyroid not enlarged. No cervical lymphadenopathy. Lungs clear. Heart regular rate and rhythm.  Abdomen is soft. Bowel sounds are positive. No hepatomegaly. No abdominal masses felt. No tenderness.  No edema to lower extremities.         Assessment & Plan:  Diarrhea. ? Etiology. She has multiple BMs in a day.  Recent colonoscopy in December with 5 polyps. I will ask Lupita Leash to locate colonoscopy and path report.  Am going to get stool studies. Am going to start her low dose Dicyclomine BID.  OV in 3 months Stool diary

## 2015-04-26 ENCOUNTER — Encounter (INDEPENDENT_AMBULATORY_CARE_PROVIDER_SITE_OTHER): Payer: Self-pay

## 2015-05-14 LAB — OTHER SOLSTAS TEST

## 2015-05-16 ENCOUNTER — Telehealth (INDEPENDENT_AMBULATORY_CARE_PROVIDER_SITE_OTHER): Payer: Self-pay | Admitting: Internal Medicine

## 2015-05-16 DIAGNOSIS — R197 Diarrhea, unspecified: Secondary | ICD-10-CM

## 2015-05-16 NOTE — Telephone Encounter (Signed)
GI pathogen ordered 

## 2015-07-24 ENCOUNTER — Ambulatory Visit (INDEPENDENT_AMBULATORY_CARE_PROVIDER_SITE_OTHER): Payer: Medicare HMO | Admitting: Internal Medicine

## 2015-11-22 DIAGNOSIS — E782 Mixed hyperlipidemia: Secondary | ICD-10-CM | POA: Diagnosis not present

## 2015-11-22 DIAGNOSIS — I10 Essential (primary) hypertension: Secondary | ICD-10-CM | POA: Diagnosis not present

## 2015-11-29 DIAGNOSIS — L409 Psoriasis, unspecified: Secondary | ICD-10-CM | POA: Diagnosis not present

## 2015-11-29 DIAGNOSIS — I10 Essential (primary) hypertension: Secondary | ICD-10-CM | POA: Diagnosis not present

## 2015-11-29 DIAGNOSIS — J309 Allergic rhinitis, unspecified: Secondary | ICD-10-CM | POA: Diagnosis not present

## 2015-11-29 DIAGNOSIS — E782 Mixed hyperlipidemia: Secondary | ICD-10-CM | POA: Diagnosis not present

## 2016-03-02 DIAGNOSIS — R319 Hematuria, unspecified: Secondary | ICD-10-CM | POA: Diagnosis not present

## 2016-03-02 DIAGNOSIS — M545 Low back pain: Secondary | ICD-10-CM | POA: Diagnosis not present

## 2016-03-09 ENCOUNTER — Other Ambulatory Visit (HOSPITAL_COMMUNITY): Payer: Self-pay | Admitting: Internal Medicine

## 2016-03-09 DIAGNOSIS — R319 Hematuria, unspecified: Secondary | ICD-10-CM

## 2016-03-11 DIAGNOSIS — J060 Acute laryngopharyngitis: Secondary | ICD-10-CM | POA: Diagnosis not present

## 2016-03-12 ENCOUNTER — Ambulatory Visit (HOSPITAL_COMMUNITY): Payer: Medicare PPO

## 2016-03-20 ENCOUNTER — Ambulatory Visit (HOSPITAL_COMMUNITY): Payer: Medicare PPO

## 2016-03-22 DIAGNOSIS — R300 Dysuria: Secondary | ICD-10-CM | POA: Diagnosis not present

## 2016-03-22 DIAGNOSIS — R3129 Other microscopic hematuria: Secondary | ICD-10-CM | POA: Diagnosis not present

## 2016-03-24 ENCOUNTER — Ambulatory Visit (HOSPITAL_COMMUNITY): Payer: Medicare PPO

## 2016-03-24 DIAGNOSIS — R109 Unspecified abdominal pain: Secondary | ICD-10-CM | POA: Diagnosis not present

## 2016-03-24 DIAGNOSIS — R351 Nocturia: Secondary | ICD-10-CM | POA: Diagnosis not present

## 2016-03-24 DIAGNOSIS — R319 Hematuria, unspecified: Secondary | ICD-10-CM | POA: Diagnosis not present

## 2016-03-25 DIAGNOSIS — R351 Nocturia: Secondary | ICD-10-CM | POA: Diagnosis not present

## 2016-03-25 DIAGNOSIS — R319 Hematuria, unspecified: Secondary | ICD-10-CM | POA: Diagnosis not present

## 2016-03-29 DIAGNOSIS — N10 Acute tubulo-interstitial nephritis: Secondary | ICD-10-CM | POA: Diagnosis not present

## 2016-03-29 DIAGNOSIS — Z87891 Personal history of nicotine dependence: Secondary | ICD-10-CM | POA: Diagnosis not present

## 2016-03-29 DIAGNOSIS — R0902 Hypoxemia: Secondary | ICD-10-CM | POA: Diagnosis not present

## 2016-03-29 DIAGNOSIS — N2889 Other specified disorders of kidney and ureter: Secondary | ICD-10-CM | POA: Diagnosis not present

## 2016-03-29 DIAGNOSIS — E861 Hypovolemia: Secondary | ICD-10-CM | POA: Diagnosis not present

## 2016-03-29 DIAGNOSIS — R0602 Shortness of breath: Secondary | ICD-10-CM | POA: Diagnosis not present

## 2016-03-29 DIAGNOSIS — I10 Essential (primary) hypertension: Secondary | ICD-10-CM | POA: Diagnosis not present

## 2016-03-29 DIAGNOSIS — E875 Hyperkalemia: Secondary | ICD-10-CM | POA: Diagnosis not present

## 2016-03-29 DIAGNOSIS — R21 Rash and other nonspecific skin eruption: Secondary | ICD-10-CM | POA: Diagnosis not present

## 2016-03-29 DIAGNOSIS — N179 Acute kidney failure, unspecified: Secondary | ICD-10-CM | POA: Diagnosis not present

## 2016-03-29 DIAGNOSIS — T3695XA Adverse effect of unspecified systemic antibiotic, initial encounter: Secondary | ICD-10-CM | POA: Diagnosis not present

## 2016-03-29 DIAGNOSIS — M2550 Pain in unspecified joint: Secondary | ICD-10-CM | POA: Diagnosis not present

## 2016-04-07 DIAGNOSIS — N179 Acute kidney failure, unspecified: Secondary | ICD-10-CM | POA: Diagnosis not present

## 2016-04-07 DIAGNOSIS — N12 Tubulo-interstitial nephritis, not specified as acute or chronic: Secondary | ICD-10-CM | POA: Diagnosis not present

## 2016-04-17 DIAGNOSIS — N179 Acute kidney failure, unspecified: Secondary | ICD-10-CM | POA: Diagnosis not present

## 2016-05-28 DIAGNOSIS — I10 Essential (primary) hypertension: Secondary | ICD-10-CM | POA: Diagnosis not present

## 2016-05-28 DIAGNOSIS — N12 Tubulo-interstitial nephritis, not specified as acute or chronic: Secondary | ICD-10-CM | POA: Diagnosis not present

## 2016-05-28 DIAGNOSIS — N179 Acute kidney failure, unspecified: Secondary | ICD-10-CM | POA: Diagnosis not present

## 2016-12-23 DIAGNOSIS — J060 Acute laryngopharyngitis: Secondary | ICD-10-CM | POA: Diagnosis not present

## 2016-12-23 DIAGNOSIS — Z6826 Body mass index (BMI) 26.0-26.9, adult: Secondary | ICD-10-CM | POA: Diagnosis not present

## 2016-12-26 DIAGNOSIS — J060 Acute laryngopharyngitis: Secondary | ICD-10-CM | POA: Diagnosis not present

## 2016-12-26 DIAGNOSIS — Z6826 Body mass index (BMI) 26.0-26.9, adult: Secondary | ICD-10-CM | POA: Diagnosis not present

## 2017-02-09 DIAGNOSIS — I10 Essential (primary) hypertension: Secondary | ICD-10-CM | POA: Diagnosis not present

## 2017-02-09 DIAGNOSIS — N182 Chronic kidney disease, stage 2 (mild): Secondary | ICD-10-CM | POA: Diagnosis not present

## 2017-07-01 DIAGNOSIS — R0789 Other chest pain: Secondary | ICD-10-CM | POA: Diagnosis not present

## 2017-07-01 DIAGNOSIS — Z91041 Radiographic dye allergy status: Secondary | ICD-10-CM | POA: Diagnosis not present

## 2017-07-01 DIAGNOSIS — R21 Rash and other nonspecific skin eruption: Secondary | ICD-10-CM | POA: Diagnosis not present

## 2017-07-01 DIAGNOSIS — N189 Chronic kidney disease, unspecified: Secondary | ICD-10-CM | POA: Diagnosis not present

## 2017-07-01 DIAGNOSIS — I129 Hypertensive chronic kidney disease with stage 1 through stage 4 chronic kidney disease, or unspecified chronic kidney disease: Secondary | ICD-10-CM | POA: Diagnosis not present

## 2017-07-01 DIAGNOSIS — Z87891 Personal history of nicotine dependence: Secondary | ICD-10-CM | POA: Diagnosis not present

## 2017-07-01 DIAGNOSIS — M25551 Pain in right hip: Secondary | ICD-10-CM | POA: Diagnosis not present

## 2017-07-06 DIAGNOSIS — M9903 Segmental and somatic dysfunction of lumbar region: Secondary | ICD-10-CM | POA: Diagnosis not present

## 2017-07-06 DIAGNOSIS — S33140A Subluxation of L4/L5 lumbar vertebra, initial encounter: Secondary | ICD-10-CM | POA: Diagnosis not present

## 2017-07-10 DIAGNOSIS — S33140A Subluxation of L4/L5 lumbar vertebra, initial encounter: Secondary | ICD-10-CM | POA: Diagnosis not present

## 2017-07-10 DIAGNOSIS — M9903 Segmental and somatic dysfunction of lumbar region: Secondary | ICD-10-CM | POA: Diagnosis not present

## 2017-07-13 DIAGNOSIS — M9903 Segmental and somatic dysfunction of lumbar region: Secondary | ICD-10-CM | POA: Diagnosis not present

## 2017-07-13 DIAGNOSIS — S33140A Subluxation of L4/L5 lumbar vertebra, initial encounter: Secondary | ICD-10-CM | POA: Diagnosis not present

## 2017-07-16 DIAGNOSIS — S33140A Subluxation of L4/L5 lumbar vertebra, initial encounter: Secondary | ICD-10-CM | POA: Diagnosis not present

## 2017-07-16 DIAGNOSIS — M9903 Segmental and somatic dysfunction of lumbar region: Secondary | ICD-10-CM | POA: Diagnosis not present

## 2017-07-21 DIAGNOSIS — E782 Mixed hyperlipidemia: Secondary | ICD-10-CM | POA: Diagnosis not present

## 2017-07-21 DIAGNOSIS — I10 Essential (primary) hypertension: Secondary | ICD-10-CM | POA: Diagnosis not present

## 2017-07-23 DIAGNOSIS — M791 Myalgia: Secondary | ICD-10-CM | POA: Diagnosis not present

## 2017-07-23 DIAGNOSIS — R7301 Impaired fasting glucose: Secondary | ICD-10-CM | POA: Diagnosis not present

## 2017-07-23 DIAGNOSIS — D72829 Elevated white blood cell count, unspecified: Secondary | ICD-10-CM | POA: Diagnosis not present

## 2017-07-23 DIAGNOSIS — E782 Mixed hyperlipidemia: Secondary | ICD-10-CM | POA: Diagnosis not present

## 2017-07-23 DIAGNOSIS — R944 Abnormal results of kidney function studies: Secondary | ICD-10-CM | POA: Diagnosis not present

## 2017-08-06 DIAGNOSIS — M791 Myalgia, unspecified site: Secondary | ICD-10-CM | POA: Diagnosis not present

## 2017-08-10 DIAGNOSIS — H5711 Ocular pain, right eye: Secondary | ICD-10-CM | POA: Diagnosis not present

## 2017-08-11 DIAGNOSIS — M353 Polymyalgia rheumatica: Secondary | ICD-10-CM | POA: Diagnosis not present

## 2017-08-11 DIAGNOSIS — R51 Headache: Secondary | ICD-10-CM | POA: Diagnosis not present

## 2017-08-12 DIAGNOSIS — M353 Polymyalgia rheumatica: Secondary | ICD-10-CM | POA: Diagnosis not present

## 2017-08-12 DIAGNOSIS — R51 Headache: Secondary | ICD-10-CM | POA: Diagnosis not present

## 2017-08-12 DIAGNOSIS — M791 Myalgia, unspecified site: Secondary | ICD-10-CM | POA: Diagnosis not present

## 2017-08-17 DIAGNOSIS — M545 Low back pain: Secondary | ICD-10-CM | POA: Diagnosis not present

## 2017-08-17 DIAGNOSIS — Z6824 Body mass index (BMI) 24.0-24.9, adult: Secondary | ICD-10-CM | POA: Diagnosis not present

## 2017-08-19 DIAGNOSIS — M353 Polymyalgia rheumatica: Secondary | ICD-10-CM | POA: Diagnosis not present

## 2017-08-23 DIAGNOSIS — R51 Headache: Secondary | ICD-10-CM | POA: Diagnosis not present

## 2017-08-23 DIAGNOSIS — R7982 Elevated C-reactive protein (CRP): Secondary | ICD-10-CM | POA: Diagnosis not present

## 2017-08-26 DIAGNOSIS — R7982 Elevated C-reactive protein (CRP): Secondary | ICD-10-CM | POA: Diagnosis not present

## 2017-08-26 DIAGNOSIS — R51 Headache: Secondary | ICD-10-CM | POA: Diagnosis not present

## 2017-08-26 DIAGNOSIS — M353 Polymyalgia rheumatica: Secondary | ICD-10-CM | POA: Diagnosis not present

## 2017-08-26 DIAGNOSIS — G9009 Other idiopathic peripheral autonomic neuropathy: Secondary | ICD-10-CM | POA: Diagnosis not present

## 2017-09-02 DIAGNOSIS — R350 Frequency of micturition: Secondary | ICD-10-CM | POA: Diagnosis not present

## 2017-09-02 DIAGNOSIS — I10 Essential (primary) hypertension: Secondary | ICD-10-CM | POA: Diagnosis not present

## 2017-09-02 DIAGNOSIS — M353 Polymyalgia rheumatica: Secondary | ICD-10-CM | POA: Diagnosis not present

## 2017-09-16 DIAGNOSIS — M353 Polymyalgia rheumatica: Secondary | ICD-10-CM | POA: Diagnosis not present

## 2017-09-16 DIAGNOSIS — R350 Frequency of micturition: Secondary | ICD-10-CM | POA: Diagnosis not present

## 2017-09-16 DIAGNOSIS — N390 Urinary tract infection, site not specified: Secondary | ICD-10-CM | POA: Diagnosis not present

## 2017-09-30 DIAGNOSIS — M353 Polymyalgia rheumatica: Secondary | ICD-10-CM | POA: Diagnosis not present

## 2017-10-14 DIAGNOSIS — M2550 Pain in unspecified joint: Secondary | ICD-10-CM | POA: Diagnosis not present

## 2017-10-14 DIAGNOSIS — M353 Polymyalgia rheumatica: Secondary | ICD-10-CM | POA: Diagnosis not present

## 2017-11-09 DIAGNOSIS — M353 Polymyalgia rheumatica: Secondary | ICD-10-CM | POA: Diagnosis not present

## 2017-11-09 DIAGNOSIS — R0602 Shortness of breath: Secondary | ICD-10-CM | POA: Diagnosis not present

## 2017-11-09 DIAGNOSIS — M791 Myalgia, unspecified site: Secondary | ICD-10-CM | POA: Diagnosis not present

## 2017-11-09 DIAGNOSIS — R05 Cough: Secondary | ICD-10-CM | POA: Diagnosis not present

## 2017-11-09 DIAGNOSIS — J09X2 Influenza due to identified novel influenza A virus with other respiratory manifestations: Secondary | ICD-10-CM | POA: Diagnosis not present

## 2017-11-09 DIAGNOSIS — J101 Influenza due to other identified influenza virus with other respiratory manifestations: Secondary | ICD-10-CM | POA: Diagnosis not present

## 2017-11-10 DIAGNOSIS — J9602 Acute respiratory failure with hypercapnia: Secondary | ICD-10-CM | POA: Diagnosis not present

## 2017-11-10 DIAGNOSIS — J439 Emphysema, unspecified: Secondary | ICD-10-CM | POA: Diagnosis not present

## 2017-11-10 DIAGNOSIS — J101 Influenza due to other identified influenza virus with other respiratory manifestations: Secondary | ICD-10-CM | POA: Diagnosis not present

## 2017-11-10 DIAGNOSIS — J1000 Influenza due to other identified influenza virus with unspecified type of pneumonia: Secondary | ICD-10-CM | POA: Diagnosis not present

## 2017-11-10 DIAGNOSIS — E041 Nontoxic single thyroid nodule: Secondary | ICD-10-CM | POA: Diagnosis not present

## 2017-11-10 DIAGNOSIS — R0989 Other specified symptoms and signs involving the circulatory and respiratory systems: Secondary | ICD-10-CM | POA: Diagnosis not present

## 2017-11-10 DIAGNOSIS — J09X2 Influenza due to identified novel influenza A virus with other respiratory manifestations: Secondary | ICD-10-CM | POA: Diagnosis not present

## 2017-11-10 DIAGNOSIS — R609 Edema, unspecified: Secondary | ICD-10-CM | POA: Diagnosis not present

## 2017-11-10 DIAGNOSIS — R0602 Shortness of breath: Secondary | ICD-10-CM | POA: Diagnosis not present

## 2017-11-10 DIAGNOSIS — J189 Pneumonia, unspecified organism: Secondary | ICD-10-CM | POA: Diagnosis not present

## 2017-11-10 DIAGNOSIS — I4891 Unspecified atrial fibrillation: Secondary | ICD-10-CM | POA: Diagnosis not present

## 2017-11-10 DIAGNOSIS — R0902 Hypoxemia: Secondary | ICD-10-CM | POA: Diagnosis not present

## 2017-11-10 DIAGNOSIS — J984 Other disorders of lung: Secondary | ICD-10-CM | POA: Diagnosis not present

## 2017-11-10 DIAGNOSIS — I10 Essential (primary) hypertension: Secondary | ICD-10-CM | POA: Diagnosis not present

## 2017-11-10 DIAGNOSIS — N179 Acute kidney failure, unspecified: Secondary | ICD-10-CM | POA: Diagnosis not present

## 2017-11-10 DIAGNOSIS — I517 Cardiomegaly: Secondary | ICD-10-CM | POA: Diagnosis not present

## 2017-11-10 DIAGNOSIS — R0600 Dyspnea, unspecified: Secondary | ICD-10-CM | POA: Diagnosis not present

## 2017-11-10 DIAGNOSIS — Z7952 Long term (current) use of systemic steroids: Secondary | ICD-10-CM | POA: Diagnosis not present

## 2017-11-10 DIAGNOSIS — M353 Polymyalgia rheumatica: Secondary | ICD-10-CM | POA: Diagnosis not present

## 2017-11-10 DIAGNOSIS — G92 Toxic encephalopathy: Secondary | ICD-10-CM | POA: Diagnosis not present

## 2017-11-10 DIAGNOSIS — J9601 Acute respiratory failure with hypoxia: Secondary | ICD-10-CM | POA: Diagnosis not present

## 2017-11-10 DIAGNOSIS — A419 Sepsis, unspecified organism: Secondary | ICD-10-CM | POA: Diagnosis not present

## 2017-11-10 DIAGNOSIS — E870 Hyperosmolality and hypernatremia: Secondary | ICD-10-CM | POA: Diagnosis not present

## 2017-11-10 DIAGNOSIS — J810 Acute pulmonary edema: Secondary | ICD-10-CM | POA: Diagnosis not present

## 2017-11-10 DIAGNOSIS — A4189 Other specified sepsis: Secondary | ICD-10-CM | POA: Diagnosis not present

## 2017-11-10 DIAGNOSIS — R918 Other nonspecific abnormal finding of lung field: Secondary | ICD-10-CM | POA: Diagnosis not present

## 2017-11-10 DIAGNOSIS — I700 Atherosclerosis of aorta: Secondary | ICD-10-CM | POA: Diagnosis not present

## 2017-11-10 DIAGNOSIS — I480 Paroxysmal atrial fibrillation: Secondary | ICD-10-CM | POA: Diagnosis not present

## 2017-11-21 DIAGNOSIS — I480 Paroxysmal atrial fibrillation: Secondary | ICD-10-CM | POA: Diagnosis not present

## 2017-11-30 DIAGNOSIS — J439 Emphysema, unspecified: Secondary | ICD-10-CM | POA: Diagnosis not present

## 2017-11-30 DIAGNOSIS — Z9981 Dependence on supplemental oxygen: Secondary | ICD-10-CM | POA: Diagnosis not present

## 2017-11-30 DIAGNOSIS — J189 Pneumonia, unspecified organism: Secondary | ICD-10-CM | POA: Diagnosis not present

## 2017-11-30 DIAGNOSIS — M353 Polymyalgia rheumatica: Secondary | ICD-10-CM | POA: Diagnosis not present

## 2017-12-10 DIAGNOSIS — I4891 Unspecified atrial fibrillation: Secondary | ICD-10-CM | POA: Diagnosis not present

## 2017-12-14 DIAGNOSIS — T7840XA Allergy, unspecified, initial encounter: Secondary | ICD-10-CM | POA: Diagnosis not present

## 2017-12-16 DIAGNOSIS — I4891 Unspecified atrial fibrillation: Secondary | ICD-10-CM | POA: Diagnosis not present

## 2017-12-16 DIAGNOSIS — L259 Unspecified contact dermatitis, unspecified cause: Secondary | ICD-10-CM | POA: Diagnosis not present

## 2017-12-16 DIAGNOSIS — I480 Paroxysmal atrial fibrillation: Secondary | ICD-10-CM | POA: Diagnosis not present

## 2017-12-16 DIAGNOSIS — T7840XA Allergy, unspecified, initial encounter: Secondary | ICD-10-CM | POA: Diagnosis not present

## 2017-12-16 DIAGNOSIS — R21 Rash and other nonspecific skin eruption: Secondary | ICD-10-CM | POA: Diagnosis not present

## 2017-12-16 DIAGNOSIS — M353 Polymyalgia rheumatica: Secondary | ICD-10-CM | POA: Diagnosis not present

## 2017-12-23 DIAGNOSIS — I480 Paroxysmal atrial fibrillation: Secondary | ICD-10-CM | POA: Diagnosis not present

## 2017-12-23 DIAGNOSIS — I10 Essential (primary) hypertension: Secondary | ICD-10-CM | POA: Diagnosis not present

## 2017-12-30 DIAGNOSIS — M353 Polymyalgia rheumatica: Secondary | ICD-10-CM | POA: Diagnosis not present

## 2017-12-30 DIAGNOSIS — Z0000 Encounter for general adult medical examination without abnormal findings: Secondary | ICD-10-CM | POA: Diagnosis not present

## 2018-01-13 DIAGNOSIS — M353 Polymyalgia rheumatica: Secondary | ICD-10-CM | POA: Diagnosis not present

## 2018-01-19 DIAGNOSIS — Z7901 Long term (current) use of anticoagulants: Secondary | ICD-10-CM | POA: Diagnosis not present

## 2018-01-19 DIAGNOSIS — I10 Essential (primary) hypertension: Secondary | ICD-10-CM | POA: Diagnosis not present

## 2018-01-19 DIAGNOSIS — I480 Paroxysmal atrial fibrillation: Secondary | ICD-10-CM | POA: Diagnosis not present

## 2018-01-19 DIAGNOSIS — G4730 Sleep apnea, unspecified: Secondary | ICD-10-CM | POA: Diagnosis not present

## 2018-01-27 DIAGNOSIS — M353 Polymyalgia rheumatica: Secondary | ICD-10-CM | POA: Diagnosis not present

## 2018-01-27 DIAGNOSIS — Z7952 Long term (current) use of systemic steroids: Secondary | ICD-10-CM | POA: Diagnosis not present

## 2018-02-10 DIAGNOSIS — M353 Polymyalgia rheumatica: Secondary | ICD-10-CM | POA: Diagnosis not present

## 2018-03-03 DIAGNOSIS — M353 Polymyalgia rheumatica: Secondary | ICD-10-CM | POA: Diagnosis not present

## 2018-03-17 DIAGNOSIS — M353 Polymyalgia rheumatica: Secondary | ICD-10-CM | POA: Diagnosis not present

## 2018-03-17 DIAGNOSIS — F419 Anxiety disorder, unspecified: Secondary | ICD-10-CM | POA: Diagnosis not present

## 2018-03-25 DIAGNOSIS — R0902 Hypoxemia: Secondary | ICD-10-CM | POA: Diagnosis not present

## 2018-03-25 DIAGNOSIS — R918 Other nonspecific abnormal finding of lung field: Secondary | ICD-10-CM | POA: Diagnosis not present

## 2018-04-26 DIAGNOSIS — I10 Essential (primary) hypertension: Secondary | ICD-10-CM | POA: Diagnosis not present

## 2018-04-26 DIAGNOSIS — M353 Polymyalgia rheumatica: Secondary | ICD-10-CM | POA: Diagnosis not present

## 2018-04-26 DIAGNOSIS — Z0000 Encounter for general adult medical examination without abnormal findings: Secondary | ICD-10-CM | POA: Diagnosis not present

## 2018-04-26 DIAGNOSIS — Z23 Encounter for immunization: Secondary | ICD-10-CM | POA: Diagnosis not present

## 2018-04-26 DIAGNOSIS — Z7189 Other specified counseling: Secondary | ICD-10-CM | POA: Diagnosis not present

## 2018-05-26 DIAGNOSIS — J029 Acute pharyngitis, unspecified: Secondary | ICD-10-CM | POA: Diagnosis not present

## 2018-05-31 DIAGNOSIS — E7800 Pure hypercholesterolemia, unspecified: Secondary | ICD-10-CM | POA: Diagnosis not present

## 2018-05-31 DIAGNOSIS — M353 Polymyalgia rheumatica: Secondary | ICD-10-CM | POA: Diagnosis not present

## 2018-07-06 DIAGNOSIS — E559 Vitamin D deficiency, unspecified: Secondary | ICD-10-CM | POA: Diagnosis not present

## 2018-07-06 DIAGNOSIS — M353 Polymyalgia rheumatica: Secondary | ICD-10-CM | POA: Diagnosis not present

## 2018-07-06 DIAGNOSIS — I10 Essential (primary) hypertension: Secondary | ICD-10-CM | POA: Diagnosis not present

## 2018-07-26 DIAGNOSIS — Z13820 Encounter for screening for osteoporosis: Secondary | ICD-10-CM | POA: Diagnosis not present

## 2018-07-26 DIAGNOSIS — Z79899 Other long term (current) drug therapy: Secondary | ICD-10-CM | POA: Diagnosis not present

## 2018-07-26 DIAGNOSIS — F419 Anxiety disorder, unspecified: Secondary | ICD-10-CM | POA: Diagnosis not present

## 2018-07-26 DIAGNOSIS — M353 Polymyalgia rheumatica: Secondary | ICD-10-CM | POA: Diagnosis not present

## 2018-07-26 DIAGNOSIS — E785 Hyperlipidemia, unspecified: Secondary | ICD-10-CM | POA: Diagnosis not present

## 2018-07-26 DIAGNOSIS — L659 Nonscarring hair loss, unspecified: Secondary | ICD-10-CM | POA: Diagnosis not present

## 2018-07-26 DIAGNOSIS — E559 Vitamin D deficiency, unspecified: Secondary | ICD-10-CM | POA: Diagnosis not present

## 2018-07-26 DIAGNOSIS — R700 Elevated erythrocyte sedimentation rate: Secondary | ICD-10-CM | POA: Diagnosis not present

## 2018-07-26 DIAGNOSIS — M130 Polyarthritis, unspecified: Secondary | ICD-10-CM | POA: Diagnosis not present

## 2018-07-26 DIAGNOSIS — I10 Essential (primary) hypertension: Secondary | ICD-10-CM | POA: Diagnosis not present

## 2018-07-27 DIAGNOSIS — G4730 Sleep apnea, unspecified: Secondary | ICD-10-CM | POA: Diagnosis not present

## 2018-08-24 DIAGNOSIS — M545 Low back pain: Secondary | ICD-10-CM | POA: Diagnosis not present

## 2018-08-24 DIAGNOSIS — L568 Other specified acute skin changes due to ultraviolet radiation: Secondary | ICD-10-CM | POA: Diagnosis not present

## 2018-08-24 DIAGNOSIS — L659 Nonscarring hair loss, unspecified: Secondary | ICD-10-CM | POA: Diagnosis not present

## 2018-08-24 DIAGNOSIS — E785 Hyperlipidemia, unspecified: Secondary | ICD-10-CM | POA: Diagnosis not present

## 2018-08-24 DIAGNOSIS — Z2821 Immunization not carried out because of patient refusal: Secondary | ICD-10-CM | POA: Diagnosis not present

## 2018-08-24 DIAGNOSIS — M353 Polymyalgia rheumatica: Secondary | ICD-10-CM | POA: Diagnosis not present

## 2018-08-24 DIAGNOSIS — Z13820 Encounter for screening for osteoporosis: Secondary | ICD-10-CM | POA: Diagnosis not present

## 2018-08-24 DIAGNOSIS — I10 Essential (primary) hypertension: Secondary | ICD-10-CM | POA: Diagnosis not present

## 2018-08-24 DIAGNOSIS — E559 Vitamin D deficiency, unspecified: Secondary | ICD-10-CM | POA: Diagnosis not present

## 2018-09-02 DIAGNOSIS — Z23 Encounter for immunization: Secondary | ICD-10-CM | POA: Diagnosis not present

## 2018-09-02 DIAGNOSIS — R739 Hyperglycemia, unspecified: Secondary | ICD-10-CM | POA: Diagnosis not present

## 2018-09-02 DIAGNOSIS — M353 Polymyalgia rheumatica: Secondary | ICD-10-CM | POA: Diagnosis not present

## 2018-09-05 DIAGNOSIS — J9601 Acute respiratory failure with hypoxia: Secondary | ICD-10-CM | POA: Diagnosis not present

## 2018-09-05 DIAGNOSIS — G4733 Obstructive sleep apnea (adult) (pediatric): Secondary | ICD-10-CM | POA: Diagnosis not present

## 2018-09-15 DIAGNOSIS — M353 Polymyalgia rheumatica: Secondary | ICD-10-CM | POA: Diagnosis not present

## 2018-10-05 DIAGNOSIS — G4733 Obstructive sleep apnea (adult) (pediatric): Secondary | ICD-10-CM | POA: Diagnosis not present

## 2018-10-05 DIAGNOSIS — J9601 Acute respiratory failure with hypoxia: Secondary | ICD-10-CM | POA: Diagnosis not present

## 2018-10-05 DIAGNOSIS — M353 Polymyalgia rheumatica: Secondary | ICD-10-CM | POA: Diagnosis not present

## 2018-11-01 DIAGNOSIS — G4733 Obstructive sleep apnea (adult) (pediatric): Secondary | ICD-10-CM | POA: Diagnosis not present

## 2018-11-01 DIAGNOSIS — J9601 Acute respiratory failure with hypoxia: Secondary | ICD-10-CM | POA: Diagnosis not present

## 2018-11-10 DIAGNOSIS — M353 Polymyalgia rheumatica: Secondary | ICD-10-CM | POA: Diagnosis not present

## 2018-11-10 DIAGNOSIS — I10 Essential (primary) hypertension: Secondary | ICD-10-CM | POA: Diagnosis not present

## 2018-11-10 DIAGNOSIS — F419 Anxiety disorder, unspecified: Secondary | ICD-10-CM | POA: Diagnosis not present

## 2018-11-24 DIAGNOSIS — Z13820 Encounter for screening for osteoporosis: Secondary | ICD-10-CM | POA: Diagnosis not present

## 2018-12-15 DIAGNOSIS — E7800 Pure hypercholesterolemia, unspecified: Secondary | ICD-10-CM | POA: Diagnosis not present

## 2018-12-15 DIAGNOSIS — M353 Polymyalgia rheumatica: Secondary | ICD-10-CM | POA: Diagnosis not present

## 2018-12-20 DIAGNOSIS — Z2889 Immunization not carried out for other reason: Secondary | ICD-10-CM | POA: Diagnosis not present

## 2018-12-20 DIAGNOSIS — M545 Low back pain: Secondary | ICD-10-CM | POA: Diagnosis not present

## 2018-12-20 DIAGNOSIS — M810 Age-related osteoporosis without current pathological fracture: Secondary | ICD-10-CM | POA: Diagnosis not present

## 2018-12-20 DIAGNOSIS — E785 Hyperlipidemia, unspecified: Secondary | ICD-10-CM | POA: Diagnosis not present

## 2018-12-20 DIAGNOSIS — E559 Vitamin D deficiency, unspecified: Secondary | ICD-10-CM | POA: Diagnosis not present

## 2018-12-20 DIAGNOSIS — M353 Polymyalgia rheumatica: Secondary | ICD-10-CM | POA: Diagnosis not present

## 2018-12-20 DIAGNOSIS — L659 Nonscarring hair loss, unspecified: Secondary | ICD-10-CM | POA: Diagnosis not present

## 2018-12-20 DIAGNOSIS — Z79899 Other long term (current) drug therapy: Secondary | ICD-10-CM | POA: Diagnosis not present

## 2018-12-20 DIAGNOSIS — M546 Pain in thoracic spine: Secondary | ICD-10-CM | POA: Diagnosis not present

## 2018-12-20 DIAGNOSIS — I10 Essential (primary) hypertension: Secondary | ICD-10-CM | POA: Diagnosis not present

## 2018-12-21 DIAGNOSIS — R0789 Other chest pain: Secondary | ICD-10-CM | POA: Diagnosis not present

## 2018-12-21 DIAGNOSIS — I10 Essential (primary) hypertension: Secondary | ICD-10-CM | POA: Diagnosis not present

## 2018-12-21 DIAGNOSIS — Z7901 Long term (current) use of anticoagulants: Secondary | ICD-10-CM | POA: Diagnosis not present

## 2018-12-21 DIAGNOSIS — E785 Hyperlipidemia, unspecified: Secondary | ICD-10-CM | POA: Diagnosis not present

## 2018-12-21 DIAGNOSIS — I480 Paroxysmal atrial fibrillation: Secondary | ICD-10-CM | POA: Diagnosis not present

## 2019-01-09 DIAGNOSIS — I480 Paroxysmal atrial fibrillation: Secondary | ICD-10-CM | POA: Diagnosis not present

## 2019-02-28 DIAGNOSIS — R0602 Shortness of breath: Secondary | ICD-10-CM | POA: Diagnosis not present

## 2019-03-14 DIAGNOSIS — E559 Vitamin D deficiency, unspecified: Secondary | ICD-10-CM | POA: Diagnosis not present

## 2019-03-14 DIAGNOSIS — L568 Other specified acute skin changes due to ultraviolet radiation: Secondary | ICD-10-CM | POA: Diagnosis not present

## 2019-03-14 DIAGNOSIS — I10 Essential (primary) hypertension: Secondary | ICD-10-CM | POA: Diagnosis not present

## 2019-03-14 DIAGNOSIS — Z79899 Other long term (current) drug therapy: Secondary | ICD-10-CM | POA: Diagnosis not present

## 2019-03-14 DIAGNOSIS — E785 Hyperlipidemia, unspecified: Secondary | ICD-10-CM | POA: Diagnosis not present

## 2019-03-14 DIAGNOSIS — M810 Age-related osteoporosis without current pathological fracture: Secondary | ICD-10-CM | POA: Diagnosis not present

## 2019-03-14 DIAGNOSIS — M353 Polymyalgia rheumatica: Secondary | ICD-10-CM | POA: Diagnosis not present

## 2019-03-14 DIAGNOSIS — L659 Nonscarring hair loss, unspecified: Secondary | ICD-10-CM | POA: Diagnosis not present

## 2019-03-14 DIAGNOSIS — M545 Low back pain: Secondary | ICD-10-CM | POA: Diagnosis not present

## 2019-03-14 DIAGNOSIS — Z2889 Immunization not carried out for other reason: Secondary | ICD-10-CM | POA: Diagnosis not present
